# Patient Record
Sex: Male | Born: 1970
Health system: Southern US, Community
[De-identification: ages and names within clinical notes are randomized; demographics above are authoritative.]

## PROBLEM LIST (undated history)

## (undated) DIAGNOSIS — I10 Essential (primary) hypertension: Secondary | ICD-10-CM

## (undated) HISTORY — PX: PATELLAR TENDON REPAIR: SHX737

## (undated) HISTORY — DX: Essential (primary) hypertension: I10

---

## 2009-04-27 ENCOUNTER — Encounter: Payer: Self-pay | Admitting: Orthopedic Surgery

## 2009-04-27 ENCOUNTER — Emergency Department (HOSPITAL_COMMUNITY): Admission: EM | Admit: 2009-04-27 | Discharge: 2009-04-28 | Payer: Self-pay | Admitting: Emergency Medicine

## 2009-04-28 ENCOUNTER — Ambulatory Visit: Payer: Self-pay | Admitting: Orthopedic Surgery

## 2009-04-28 DIAGNOSIS — S5290XA Unspecified fracture of unspecified forearm, initial encounter for closed fracture: Secondary | ICD-10-CM | POA: Insufficient documentation

## 2009-05-01 ENCOUNTER — Encounter: Payer: Self-pay | Admitting: Orthopedic Surgery

## 2009-05-18 ENCOUNTER — Ambulatory Visit: Payer: Self-pay | Admitting: Orthopedic Surgery

## 2009-06-18 ENCOUNTER — Ambulatory Visit: Payer: Self-pay | Admitting: Orthopedic Surgery

## 2010-05-12 ENCOUNTER — Ambulatory Visit (HOSPITAL_COMMUNITY): Admission: RE | Admit: 2010-05-12 | Discharge: 2010-05-12 | Payer: Self-pay | Admitting: Family Medicine

## 2011-10-11 ENCOUNTER — Other Ambulatory Visit (HOSPITAL_COMMUNITY): Payer: Self-pay | Admitting: Orthopaedic Surgery

## 2011-10-11 ENCOUNTER — Ambulatory Visit (HOSPITAL_COMMUNITY)
Admission: RE | Admit: 2011-10-11 | Discharge: 2011-10-11 | Disposition: A | Discharge status: Home / Self Care | Payer: 59 | Source: Ambulatory Visit | Attending: Orthopaedic Surgery | Admitting: Orthopaedic Surgery

## 2011-10-11 DIAGNOSIS — M25579 Pain in unspecified ankle and joints of unspecified foot: Secondary | ICD-10-CM | POA: Insufficient documentation

## 2011-10-11 DIAGNOSIS — M79671 Pain in right foot: Secondary | ICD-10-CM

## 2011-10-11 DIAGNOSIS — R937 Abnormal findings on diagnostic imaging of other parts of musculoskeletal system: Secondary | ICD-10-CM | POA: Insufficient documentation

## 2012-01-31 ENCOUNTER — Ambulatory Visit (INDEPENDENT_AMBULATORY_CARE_PROVIDER_SITE_OTHER): Payer: 59 | Admitting: Urology

## 2012-01-31 DIAGNOSIS — Z3009 Encounter for other general counseling and advice on contraception: Secondary | ICD-10-CM

## 2012-07-02 ENCOUNTER — Other Ambulatory Visit: Payer: Self-pay | Admitting: Orthopedic Surgery

## 2012-07-02 DIAGNOSIS — S93409A Sprain of unspecified ligament of unspecified ankle, initial encounter: Secondary | ICD-10-CM

## 2012-07-03 ENCOUNTER — Ambulatory Visit (HOSPITAL_COMMUNITY)
Admission: RE | Admit: 2012-07-03 | Discharge: 2012-07-03 | Disposition: A | Discharge status: Home / Self Care | Payer: 59 | Source: Ambulatory Visit | Attending: Orthopedic Surgery | Admitting: Orthopedic Surgery

## 2012-07-03 DIAGNOSIS — Y929 Unspecified place or not applicable: Secondary | ICD-10-CM | POA: Insufficient documentation

## 2012-07-03 DIAGNOSIS — X500XXA Overexertion from strenuous movement or load, initial encounter: Secondary | ICD-10-CM | POA: Insufficient documentation

## 2012-07-03 DIAGNOSIS — M25476 Effusion, unspecified foot: Secondary | ICD-10-CM | POA: Insufficient documentation

## 2012-07-03 DIAGNOSIS — S8263XA Displaced fracture of lateral malleolus of unspecified fibula, initial encounter for closed fracture: Secondary | ICD-10-CM | POA: Insufficient documentation

## 2012-07-03 DIAGNOSIS — M25473 Effusion, unspecified ankle: Secondary | ICD-10-CM | POA: Insufficient documentation

## 2012-07-03 DIAGNOSIS — S93409A Sprain of unspecified ligament of unspecified ankle, initial encounter: Secondary | ICD-10-CM

## 2012-07-03 DIAGNOSIS — M25579 Pain in unspecified ankle and joints of unspecified foot: Secondary | ICD-10-CM | POA: Insufficient documentation

## 2012-07-04 ENCOUNTER — Ambulatory Visit (INDEPENDENT_AMBULATORY_CARE_PROVIDER_SITE_OTHER): Payer: 59 | Admitting: Orthopedic Surgery

## 2012-07-04 ENCOUNTER — Encounter: Payer: Self-pay | Admitting: Orthopedic Surgery

## 2012-07-04 VITALS — BP 126/80 | Ht 70.0 in | Wt 206.0 lb

## 2012-07-04 DIAGNOSIS — S93409A Sprain of unspecified ligament of unspecified ankle, initial encounter: Secondary | ICD-10-CM

## 2012-07-04 NOTE — Patient Instructions (Addendum)
ASO X 3 WEEKS  ANKLE EXERCISES / ALPHABET

## 2012-07-04 NOTE — Progress Notes (Signed)
Patient ID: David Cox, male   DOB: May 05, 1971, 41 y.o.   MRN: [id number] Chief Complaint  Patient presents with  . Ankle Injury    right ankle injury, DOI 06/22/12    Stepped in a hole or injured the ankle inverting it at his home about a week and half ago. Persistent swelling. I saw him at a meeting and he still had pain and recommended x-ray. X-ray shows an avulsion fracture, distal fibula.  Review of systems swelling, otherwise, normal.  Exam vital signs stable, as recorded. Appears normal. He is oriented x3. Mood and affect are normal. Gait is surprisingly normal. He does have swelling around the ankle joint tenderness at the tip of the fibula and some what behind the fibula. This abnormal range of motion, stability, and strength. Skin is intact. Mild swelling is noted.  Ankle sprain with avulsion fracture.  Recommend ASO brace

## 2013-06-04 ENCOUNTER — Ambulatory Visit (INDEPENDENT_AMBULATORY_CARE_PROVIDER_SITE_OTHER): Payer: 59 | Admitting: Sports Medicine

## 2013-06-04 VITALS — BP 126/91 | Ht 70.0 in | Wt 198.0 lb

## 2013-06-04 DIAGNOSIS — M25579 Pain in unspecified ankle and joints of unspecified foot: Secondary | ICD-10-CM | POA: Insufficient documentation

## 2013-06-04 DIAGNOSIS — M25572 Pain in left ankle and joints of left foot: Secondary | ICD-10-CM

## 2013-06-04 NOTE — Assessment & Plan Note (Signed)
arch strap  Foam cushions of the left fifth ray  Because he is starting to develop significant foot breakdown of think we need to get him into a custom orthotic for walking or more intense exercise  We will give him a temporary orthotic until he can be scheduled for custom ones

## 2013-06-04 NOTE — Progress Notes (Signed)
Patient ID: David Cox, male   DOB: 11/24/1970, 42 y.o.   MRN: [id number]  Patient has increased his walking for fitness He also is doing a lot more walking in the hospital as he recently became EVP of [facility] and is walking to see how different departments work He has developed some fairly severe pain on the outside of his left foot at the base of the fifth metatarsal No recent injury No sprain  He played basketball growing up and has a patellar tendon rupture on the right that required repair He thinks that for a year or so he put a lot more pressure on his left foot because of recovering from that injury  Physical examination  Pleasant male in no acute distress  Standing reveals that he has moderate loss of the longitudinal arch bilaterally Rare foot alignment is good and posterior tibialis tendon works bilaterally The left lateral column is starting to sublux in the left little toe is now displaced superiorly and starting to ride over the fourth toe  The right fifth toe shows mild subluxation at the MTP joint  Tenderness is significant at the base of the fifth metatarsal only on the left This does not seem painful with resistance of eversion  Ultrasound Peroneal tendons look intact scan from above the malleolus to the insertion at the base of the fifth There is increased Doppler activity at the insertion of the peroneal brevis to the base of the fifth Mild swelling only at the base of the fifth Fifth metatarsal looks intact

## 2014-01-29 ENCOUNTER — Ambulatory Visit (INDEPENDENT_AMBULATORY_CARE_PROVIDER_SITE_OTHER): Payer: 59 | Admitting: Sports Medicine

## 2014-01-29 VITALS — BP 116/70 | Ht 70.0 in | Wt 198.0 lb

## 2014-01-29 DIAGNOSIS — M25569 Pain in unspecified knee: Secondary | ICD-10-CM

## 2014-01-29 DIAGNOSIS — M7650 Patellar tendinitis, unspecified knee: Secondary | ICD-10-CM

## 2014-01-29 DIAGNOSIS — M25561 Pain in right knee: Secondary | ICD-10-CM | POA: Insufficient documentation

## 2014-01-29 DIAGNOSIS — M25562 Pain in left knee: Principal | ICD-10-CM

## 2014-01-29 MED ORDER — NITROGLYCERIN 0.2 MG/HR TD PT24: MEDICATED_PATCH | TRANSDERMAL | Status: DC

## 2014-01-29 NOTE — Progress Notes (Signed)
Patient ID: David Cox, male   DOB: 12/11/1970, 43 y.o.   MRN: [id number]  Rupture of RT patellar tendon 96/97 Repair next day (Dr Rennis HardingEllis) Rehab p surg. Hx of Osgood Schlatter's  Since then did not run Last 3 to 4 years walks fitness Does weights Now doing more activity with spin Personal training 3x week  Squats with kettle bell/ pain at 45 deg [place] hurt knee  Sitting for a couple hours causes anterior knee pain bilaterally  Hx of RT ankle fractures - smaller avulsion type   PEXAM  NAD  BP 116/70  Ht 5\' 10"  (1.778 m)  Wt 198 lb (89.812 kg)  BMI 28.41 kg/m2  RT Knee: Normal to inspection except long midline scar  with no erythema or effusion or obvious bony abnormalities. Palpation normal with no warmth or joint line tenderness  or patellar tenderness or condyle tenderness. ROM normal in flexion and extension and lower leg rotation. Ligaments with solid consistent endpoints including ACL, PCL, LCL, MCL. Negative Mcmurray's and provocative meniscal tests. Non painful patellar compression. Patellar and quadriceps tendons are both somewhat TT deep palpation Hamstring and quadriceps strength is normal except Weakness of VMO Weakness of glut medius and glut maximus on abduction testing of leg   LT Knee Knee: Normal to inspection with no erythema or effusion or obvious bony abnormalities. Palpation normal with no warmth or joint line tenderness or patellar tenderness or condyle tenderness. ROM normal in flexion and extension and lower leg rotation. Ligaments with solid consistent endpoints including ACL, PCL, LCL, MCL. Negative Mcmurray's and provocative meniscal tests. Non painful patellar compression. Patellar and quadriceps tendons unremarkable. Hamstring and quadriceps strength is normal except milder weakness in VMO Hip abduction strength is normal  MSK US RT Knee Hypoechoic area in quadriceps tendon/ no true tear noted NO SP pouch effusion PF joint  looks OK Meniscus OK Patellar tendon shows an area of irregularity and small split in tendon mid substance Tibial tubercle shows irregularity and calcification  LT Knee Normal QT/ meniscus/ TIB tubercle Patellar tendon shows hypoechoic area over medial insertion to patella Hypoechoic change medial 40% of tendon on transverse view

## 2014-01-29 NOTE — Assessment & Plan Note (Signed)
Suspect this will improve with rehab exercises  Needs better muscle balance and there is overall weakness of RT hip abduction

## 2014-01-29 NOTE — Assessment & Plan Note (Signed)
Start NTG protocol as he has structural damage of both patellar tendons  Refer to formal PT  Avoid deep knee bends or sprinting  Reck 6 weeks

## 2014-01-29 NOTE — Patient Instructions (Addendum)
Please do suggested exercises daily  Nitroglycerin Protocol   Apply 1/4 nitroglycerin patch to affected area daily.  Change position of patch within the affected area every 24 hours.  You may experience a headache during the first 1-2 weeks of using the patch, these should subside.  If you experience headaches after beginning nitroglycerin patch treatment, you may take your preferred over the counter pain reliever.  Another side effect of the nitroglycerin patch is skin irritation or rash related to patch adhesive.  Please notify our office if you develop more severe headaches or rash, and stop the patch.  Tendon healing with nitroglycerin patch may require 12 to 24 weeks depending on the extent of injury.  Men should not use if taking Viagra, Cialis, or Levitra.   Do not use if you have migraines or rosacea.   Dr. Darrick PennaFields would like you to start going to physical therapy  Please follow up in 6 weeks  Thank you for seeing David Cox today!

## 2014-01-30 NOTE — Addendum Note (Signed)
Addended by: Jacki ConesMARTIN, AMY C on: 01/30/2014 11:17 AM   Modules accepted: Orders

## 2014-02-18 ENCOUNTER — Ambulatory Visit: Payer: 59 | Admitting: Physical Therapy

## 2014-02-25 ENCOUNTER — Ambulatory Visit: Payer: 59 | Attending: Sports Medicine | Admitting: Physical Therapy

## 2014-02-25 DIAGNOSIS — IMO0001 Reserved for inherently not codable concepts without codable children: Secondary | ICD-10-CM | POA: Insufficient documentation

## 2014-02-25 DIAGNOSIS — M25569 Pain in unspecified knee: Secondary | ICD-10-CM | POA: Insufficient documentation

## 2014-03-21 ENCOUNTER — Ambulatory Visit: Payer: 59 | Attending: Sports Medicine | Admitting: Physical Therapy

## 2014-03-21 DIAGNOSIS — Z5189 Encounter for other specified aftercare: Secondary | ICD-10-CM | POA: Insufficient documentation

## 2014-03-21 DIAGNOSIS — M25569 Pain in unspecified knee: Secondary | ICD-10-CM | POA: Insufficient documentation

## 2014-03-27 ENCOUNTER — Ambulatory Visit: Payer: 59 | Admitting: Physical Therapy

## 2014-04-04 ENCOUNTER — Ambulatory Visit: Payer: 59 | Admitting: Physical Therapy

## 2014-04-08 ENCOUNTER — Encounter: Payer: 59 | Admitting: Physical Therapy

## 2014-04-18 ENCOUNTER — Ambulatory Visit: Payer: 59 | Attending: Sports Medicine | Admitting: Physical Therapy

## 2014-04-18 DIAGNOSIS — M25569 Pain in unspecified knee: Secondary | ICD-10-CM | POA: Insufficient documentation

## 2014-04-18 DIAGNOSIS — Z5189 Encounter for other specified aftercare: Secondary | ICD-10-CM | POA: Insufficient documentation

## 2015-03-03 ENCOUNTER — Inpatient Hospital Stay (HOSPITAL_COMMUNITY)
Admission: RE | Admit: 2015-03-03 | Discharge: 2015-03-03 | Disposition: A | Discharge status: Home / Self Care | Payer: Self-pay | Attending: Pulmonary Disease | Admitting: Pulmonary Disease

## 2015-03-03 LAB — COMPREHENSIVE METABOLIC PANEL
ALK PHOS: 62 U/L (ref 39–117)
ALT: 36 U/L (ref 0–53)
ANION GAP: 10 (ref 5–15)
AST: 34 U/L (ref 0–37)
Albumin: 4.2 g/dL (ref 3.5–5.2)
BUN: 14 mg/dL (ref 6–23)
CALCIUM: 9.6 mg/dL (ref 8.4–10.5)
CHLORIDE: 98 mmol/L (ref 96–112)
CO2: 25 mmol/L (ref 19–32)
Creatinine, Ser: 1.09 mg/dL (ref 0.50–1.35)
GFR calc Af Amer: >90 mL/min (ref >90)
GFR, EST NON AFRICAN AMERICAN: 81 mL/min — AB (ref >90)
Glucose, Bld: 104 mg/dL — ABNORMAL HIGH (ref 70–99)
Potassium: 3.8 mmol/L (ref 3.5–5.1)
SODIUM: 133 mmol/L — AB (ref 135–145)
TOTAL PROTEIN: 7.3 g/dL (ref 6.0–8.3)
Total Bilirubin: 0.6 mg/dL (ref 0.3–1.2)

## 2015-03-03 LAB — LIPID PANEL
CHOLESTEROL: 229 mg/dL — AB (ref 0–200)
HDL: 51 mg/dL (ref >39)
LDL Cholesterol: 123 mg/dL — ABNORMAL HIGH (ref 0–99)
TRIGLYCERIDES: 273 mg/dL — AB (ref <150)
Total CHOL/HDL Ratio: 4.5 RATIO
VLDL: 55 mg/dL — AB (ref 0–40)

## 2015-03-03 LAB — CBC
HCT: 44.8 % (ref 39.0–52.0)
HEMOGLOBIN: 15.7 g/dL (ref 13.0–17.0)
MCH: 31.7 pg (ref 26.0–34.0)
MCHC: 35 g/dL (ref 30.0–36.0)
MCV: 90.3 fL (ref 78.0–100.0)
PLATELETS: 297 10*3/uL (ref 150–400)
RBC: 4.96 MIL/uL (ref 4.22–5.81)
RDW: 12.6 % (ref 11.5–15.5)
WBC: 6.4 10*3/uL (ref 4.0–10.5)

## 2015-03-03 LAB — TSH: TSH: 1.536 u[IU]/mL (ref 0.350–4.500)

## 2015-06-08 ENCOUNTER — Other Ambulatory Visit (HOSPITAL_COMMUNITY): Payer: Self-pay | Admitting: Internal Medicine

## 2015-06-08 ENCOUNTER — Ambulatory Visit (HOSPITAL_COMMUNITY)
Admission: RE | Admit: 2015-06-08 | Discharge: 2015-06-08 | Disposition: A | Discharge status: Home / Self Care | Payer: 59 | Source: Ambulatory Visit | Attending: Internal Medicine | Admitting: Internal Medicine

## 2015-06-08 DIAGNOSIS — R918 Other nonspecific abnormal finding of lung field: Secondary | ICD-10-CM | POA: Diagnosis not present

## 2015-06-08 DIAGNOSIS — J189 Pneumonia, unspecified organism: Secondary | ICD-10-CM

## 2015-06-08 DIAGNOSIS — R05 Cough: Secondary | ICD-10-CM | POA: Diagnosis present

## 2015-12-02 ENCOUNTER — Other Ambulatory Visit: Payer: Self-pay | Admitting: *Deleted

## 2015-12-02 DIAGNOSIS — M79671 Pain in right foot: Secondary | ICD-10-CM

## 2015-12-03 ENCOUNTER — Ambulatory Visit (HOSPITAL_COMMUNITY)
Admission: RE | Admit: 2015-12-03 | Discharge: 2015-12-03 | Disposition: A | Discharge status: Home / Self Care | Payer: 59 | Source: Ambulatory Visit | Attending: Sports Medicine | Admitting: Sports Medicine

## 2015-12-03 DIAGNOSIS — S99921A Unspecified injury of right foot, initial encounter: Secondary | ICD-10-CM | POA: Diagnosis not present

## 2015-12-03 DIAGNOSIS — M79671 Pain in right foot: Secondary | ICD-10-CM | POA: Insufficient documentation

## 2015-12-04 ENCOUNTER — Ambulatory Visit (INDEPENDENT_AMBULATORY_CARE_PROVIDER_SITE_OTHER): Payer: 59 | Admitting: Sports Medicine

## 2015-12-04 ENCOUNTER — Encounter: Payer: Self-pay | Admitting: Sports Medicine

## 2015-12-04 VITALS — BP 118/76 | Ht 70.0 in

## 2015-12-04 DIAGNOSIS — S92403A Displaced unspecified fracture of unspecified great toe, initial encounter for closed fracture: Secondary | ICD-10-CM | POA: Insufficient documentation

## 2015-12-04 DIAGNOSIS — S92401A Displaced unspecified fracture of right great toe, initial encounter for closed fracture: Secondary | ICD-10-CM

## 2015-12-04 NOTE — Progress Notes (Signed)
   Subjective:    Patient ID: David Cox, male    DOB: [birth year], 45 y.o.   MRN: [id number]  HPI chief complaint: Right foot pain  Datron comes in today complaining of 3 weeks of right great toe and foot pain. He slipped 3 weeks ago suffering a twisting injury to both the right knee and the right foot and ankle. He began to experience pain and swelling in the right great toe shortly thereafter. As a result of his injury he has been limping somewhat. Over the past week or so he has begun to notice pain and swelling along the lateral foot as well. He has been taking intermittent ibuprofen which has been helpful. Symptoms are most noticeable with prolonged standing and walking. He has tried to remain active in his cross fit class but has been unable to do things like box jumps due to his pain.   Interim medical history reviewed Surgical history is significant for a previous right knee patellar tendon rupture which required a reconstruction. This was several years ago. Medications reviewed Allergies reviewed    Review of Systems    as above Objective:   Physical Exam  Well-developed, well-nourished. No acute distress.   Right foot: There is mild swelling along the lateral aspect of the foot as well as concentrated around the right great toe. He is tender to palpation along the proximal phalanx. He does have pain with active and passive flexion and extension. Extensor and flexor tendons are intact. He has some generalized tenderness to palpation along the lateral aspect of his foot. No tenderness to palpation at the Lisfranc joint. Neurovascularly intact distally. Walking with a slight limp.  X-rays of the right foot show a minimally displaced fracture through the shaft of the proximal phalanx of the right great toe. Fracture does not appear to be intra-articular. No other fractures are seen.  MSK ultrasound of the right great toe also shows a fracture through the medial most aspect of the  distal shaft of the proximal phalanx. This is seen both in long and short view.      Assessment & Plan:  Minimally displaced proximal phalanx fracture, right great toe  Postop shoe to wear with activity, specifically walking and standing. He may bike or row for cardiovascular conditioning in his cross fit class but he is to do no high impact activity such as running or jumping. Follow-up with me in 3 weeks for reevaluation and repeat ultrasound. It may be a full 6 weeks before he is able to return to all activity. I think the pain and swelling along the lateral aspect of his foot is compensatory due to an altered gait. He will switch to Tylenol for pain control but I think he is okay to continue with intermittent ibuprofen as needed as well. He will call me with any questions or concerns that he has prior to his follow-up visit.

## 2015-12-07 MED FILL — EDARBYCLOR 40-12.5 MG TAB: 40-12.5 | 30 days supply | Qty: 30 | Fill #3

## 2015-12-14 DIAGNOSIS — H5213 Myopia, bilateral: Secondary | ICD-10-CM | POA: Diagnosis not present

## 2015-12-30 ENCOUNTER — Ambulatory Visit (INDEPENDENT_AMBULATORY_CARE_PROVIDER_SITE_OTHER): Payer: 59 | Admitting: Sports Medicine

## 2015-12-30 ENCOUNTER — Encounter: Payer: Self-pay | Admitting: Sports Medicine

## 2015-12-30 VITALS — BP 134/80 | Ht 70.0 in

## 2015-12-30 DIAGNOSIS — S92401D Displaced unspecified fracture of right great toe, subsequent encounter for fracture with routine healing: Secondary | ICD-10-CM | POA: Diagnosis not present

## 2015-12-31 NOTE — Progress Notes (Signed)
   Subjective:    Patient ID: David Cox, male    DOB: 1971/09/26, 45 y.o.   MRN: [id number]  HPI   David Cox comes in today for follow-up on his right great toe fracture. He is 3 weeks out from diagnosis. Approximately 5 weeks out from his injury. He is doing well. He has been in his postop shoe for the most part since his last office visit with me. Pain has improved dramatically. He still has occasions where he will feel some discomfort. But overall he is feeling much better.    Review of Systems     Objective:   Physical Exam  Well-developed, well-nourished. No acute distress.  Right great toe: There is limited range of motion secondary to immobilization. Mild residual swelling. Mild tenderness to palpation over the fracture site. No clinical angulation or malrotation. Flexor and extensor tendons remain intact. Neurovascularly intact distally. Walking without a limp in his postop shoe.  MSK ultrasound of the right great toe was performed. There is still a definite break in the cortex of the proximal phalanx along the medialmost aspect of the shaft. There is a double shadow on the short view which is consistent with soft callus formation.      Assessment & Plan:   3 weeks status post nondisplaced proximal phalanx fracture right great toe  Clinically the patient is doing well. I would like to correlate what I see on the ultrasound with a repeat x-ray. I think David Cox can wean from his postop shoe into a regular shoe as his symptoms allow. David Cox was asking specifically about when he can return to his normal workouts. I think he can start some recreational walking based on pain but he understands that if he has increasing discomfort then he will need to discontinue this until follow-up with me. He may certainly continue with biking. He may also start swimming. He is to refrain from box jumps and pushups at least until follow-up. Follow-up with me in 3 weeks.

## 2016-01-11 MED FILL — EDARBYCLOR 40-12.5 MG TAB: 40-12.5 | 30 days supply | Qty: 30 | Fill #4

## 2016-01-15 ENCOUNTER — Ambulatory Visit (HOSPITAL_COMMUNITY)
Admission: RE | Admit: 2016-01-15 | Discharge: 2016-01-15 | Disposition: A | Discharge status: Home / Self Care | Payer: 59 | Source: Ambulatory Visit | Attending: Sports Medicine | Admitting: Sports Medicine

## 2016-01-15 DIAGNOSIS — S92401D Displaced unspecified fracture of right great toe, subsequent encounter for fracture with routine healing: Secondary | ICD-10-CM | POA: Diagnosis not present

## 2016-01-15 DIAGNOSIS — X58XXXD Exposure to other specified factors, subsequent encounter: Secondary | ICD-10-CM | POA: Diagnosis not present

## 2016-01-15 DIAGNOSIS — S92311D Displaced fracture of first metatarsal bone, right foot, subsequent encounter for fracture with routine healing: Secondary | ICD-10-CM | POA: Diagnosis not present

## 2016-01-20 ENCOUNTER — Ambulatory Visit (INDEPENDENT_AMBULATORY_CARE_PROVIDER_SITE_OTHER): Payer: 59 | Admitting: Sports Medicine

## 2016-01-20 ENCOUNTER — Encounter: Payer: Self-pay | Admitting: Sports Medicine

## 2016-01-20 VITALS — BP 128/88 | HR 91 | Ht 70.0 in | Wt 200.0 lb

## 2016-01-20 DIAGNOSIS — S92401D Displaced unspecified fracture of right great toe, subsequent encounter for fracture with routine healing: Secondary | ICD-10-CM | POA: Diagnosis not present

## 2016-01-20 NOTE — Progress Notes (Signed)
   Subjective:    Patient ID: David Cox, male    DOB: 09/07/1971, 45 y.o.   MRN: [id number]  HPI   David Cox comes in today for follow-up on his minimally displaced proximal phalanx fracture of his right great toe. He is 6 weeks out from diagnosis, approximately 8 weeks out from injury. He is doing well. He is walking in a regular shoe. He gets occasional twinges of pain but nothing long lasting. Swelling has resolved.       Review of Systems     Objective:   Physical Exam  Well-developed, well-nourished. No acute distress.   Right great toe: Minimal tenderness to palpation over the fracture site. Swelling has resolved. There is still some mild residual stiffness at the first MTP joint. No clinical angulation or malrotation. Neurovascularly intact distally. Walking without a limp.  MSK ultrasound of the right great toe shows good callus formation surrounding the fracture. X-ray of the right great toe shows the fracture lines are no longer be present. There is good sclerosis along the fracture. Findings consistent with a nearly healed minimally displaced proximal phalanx fracture.      Assessment & Plan:  Well healing proximal phalanx fracture, right great toe  I think the patient is okay to increase all activity as tolerated. If he experiences pain with any particular activity, then he needs to discontinue that specific activity for another week or so. He also understands that there may be occasion where he feels some twinges of pain, but it should not be severe and it certainly should not limit his activity. This is normal bone remodeling and those twinges will resolve over time. I will discharge him from my care to follow-up as needed.

## 2016-02-09 MED FILL — EDARBYCLOR 40-12.5 MG TAB: 40-12.5 | 30 days supply | Qty: 30 | Fill #5

## 2016-03-14 MED FILL — EDARBYCLOR 40-12.5 MG TAB: 40-12.5 | 30 days supply | Qty: 30 | Fill #6

## 2016-03-29 ENCOUNTER — Telehealth (HOSPITAL_COMMUNITY): Payer: Self-pay | Admitting: *Deleted

## 2016-03-29 DIAGNOSIS — I10 Essential (primary) hypertension: Secondary | ICD-10-CM

## 2016-03-29 NOTE — Telephone Encounter (Signed)
Per Dr Gala RomneyBensimhon pt needs Calcium Scoring CT scan, order placed pt aware they will contact him to schedule

## 2016-04-12 MED FILL — EDARBYCLOR 40-12.5 MG TAB: 40-12.5 | 30 days supply | Qty: 30 | Fill #7

## 2016-04-20 ENCOUNTER — Ambulatory Visit (INDEPENDENT_AMBULATORY_CARE_PROVIDER_SITE_OTHER)
Admission: RE | Admit: 2016-04-20 | Discharge: 2016-04-20 | Disposition: A | Discharge status: Home / Self Care | Payer: 59 | Source: Ambulatory Visit | Attending: Internal Medicine | Admitting: Internal Medicine

## 2016-04-20 DIAGNOSIS — I10 Essential (primary) hypertension: Secondary | ICD-10-CM

## 2016-04-21 ENCOUNTER — Telehealth (HOSPITAL_COMMUNITY): Payer: Self-pay | Admitting: *Deleted

## 2016-04-21 NOTE — Telephone Encounter (Signed)
Heather to give CT results.

## 2016-05-12 MED FILL — EDARBYCLOR 40-12.5 MG TAB: 40-12.5 | 30 days supply | Qty: 30 | Fill #8

## 2016-06-15 MED FILL — EDARBYCLOR 40-12.5 MG TAB: 40-12.5 | 30 days supply | Qty: 30 | Fill #9

## 2016-07-18 MED FILL — EDARBYCLOR 40-12.5 MG TAB: 40-12.5 | 30 days supply | Qty: 30 | Fill #10

## 2016-08-19 MED FILL — EDARBYCLOR 40-12.5 MG TAB: 40-12.5 | 30 days supply | Qty: 30 | Fill #11

## 2016-09-19 MED FILL — EDARBYCLOR 40-12.5 MG TAB: 40-12.5 | 30 days supply | Qty: 30 | Fill #0

## 2016-09-27 ENCOUNTER — Encounter: Payer: Self-pay | Admitting: Sports Medicine

## 2016-09-27 ENCOUNTER — Ambulatory Visit: Payer: Self-pay

## 2016-09-27 ENCOUNTER — Ambulatory Visit (INDEPENDENT_AMBULATORY_CARE_PROVIDER_SITE_OTHER): Payer: 59 | Admitting: Sports Medicine

## 2016-09-27 VITALS — BP 135/74 | HR 90 | Ht 70.0 in | Wt 212.0 lb

## 2016-09-27 DIAGNOSIS — M25562 Pain in left knee: Secondary | ICD-10-CM

## 2016-09-27 DIAGNOSIS — G8929 Other chronic pain: Secondary | ICD-10-CM

## 2016-09-27 DIAGNOSIS — M7652 Patellar tendinitis, left knee: Secondary | ICD-10-CM

## 2016-09-27 DIAGNOSIS — M7651 Patellar tendinitis, right knee: Secondary | ICD-10-CM

## 2016-09-27 DIAGNOSIS — M25561 Pain in right knee: Secondary | ICD-10-CM | POA: Diagnosis not present

## 2016-09-27 NOTE — Patient Instructions (Signed)
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

## 2016-09-27 NOTE — Assessment & Plan Note (Signed)
Significant improvement with a strength program  He needs to continue this but modified biomechanical positioning

## 2016-09-27 NOTE — Assessment & Plan Note (Signed)
He has aggravated the quadriceps tendon on the left radius and calcific spurring  The can be treated conservatively  I suspect he needs a modified approach to squats with less knee bend  Home exercise program and gradual return to activity outlined  Use compression sleeves for lifting

## 2016-09-27 NOTE — Progress Notes (Signed)
Chief Complaint  Left knee pain  Patient has experienced some swelling in his left knee this week He has been in a modified cross fit program This involves some moderately deep squats He has been steadily increasing his weight The exercise program had led to a lot of relief of his knee pain  However, this past week he did a lot of walking and standing in the hospital He noted that his knee was swollen after that He had a lot of clicking with flexion of the knee  Past history Bilateral patellar tendinitis Patellar tendon rupture playing basketball years ago repaired on the right He has done formal physical therapy and progressed to a fitness program  Review of systems No locking No giving way No feeling of instability  Physical examination Muscular white male in no acute distress BP 135/74   Pulse 90   Ht 5\' 10"  (1.778 m)   Wt 212 lb (96.2 kg)   BMI 30.42 kg/m   Knee: Left Normal to inspection with no erythema or effusion or obvious bony abnormalities. Palpation normal with no warmth or joint line tenderness or patellar tenderness or condyle tenderness. ROM normal in flexion and extension and lower leg rotation. Ligaments with solid consistent endpoints including ACL, PCL, LCL, MCL. Negative Mcmurray's and provocative meniscal tests. Non painful patellar compression. Patellar and quadriceps tendons unremarkable. Hamstring and quadriceps strength is normal.  1 leg knee bend brings out mild medial pain And causes clicking at the top of the patella Thessaly test is negative  Ultrasound of left knee  Suprapatellar pouch shows some localized swelling but no effusion There are calcific spurs from the superior patella into the quadriceps tendon There is a hypoechoic area within the quadriceps tendon with some calcification Medial and lateral meniscus appear intact Patellar tendon appears intact there is a small spur at the patellar insertion No Baker's cyst  Comparison of  right knee reveals no spurring at the superior patella The patellar tendon has some loss of the normal fibrillary pattern and calcification in area of previous surgery There is a small avulsion off the distal patellar pole Evidence of tibial tuberosity never completely fusing with separation at growth plate  Impression; he has some quadriceps tendinopathy triggered by calcific spurs on the left No ultrasound evidence of intra-articular injury Right knee shows ultrasound changes consistent with repair of patellar tendon. Small avulsion fracture off distal patellar pole and remote Osgood-Schlatter's  Ultrasound & interpretation Royal HawthornKarl Aden Youngman M.D.

## 2016-10-18 MED FILL — EDARBYCLOR 40-12.5 MG TAB: 40-12.5 | 30 days supply | Qty: 30 | Fill #1

## 2016-11-17 DIAGNOSIS — Z0000 Encounter for general adult medical examination without abnormal findings: Secondary | ICD-10-CM | POA: Diagnosis not present

## 2016-11-24 MED FILL — EDARBYCLOR 40-12.5 MG TAB: 40-12.5 | 30 days supply | Qty: 30 | Fill #2

## 2016-12-02 ENCOUNTER — Other Ambulatory Visit (HOSPITAL_COMMUNITY)
Admission: RE | Admit: 2016-12-02 | Discharge: 2016-12-02 | Disposition: A | Discharge status: Home / Self Care | Payer: 59 | Source: Ambulatory Visit | Attending: Pulmonary Disease | Admitting: Pulmonary Disease

## 2016-12-02 DIAGNOSIS — Z0000 Encounter for general adult medical examination without abnormal findings: Secondary | ICD-10-CM | POA: Diagnosis not present

## 2016-12-02 LAB — CBC
HCT: 45.1 % (ref 39.0–52.0)
Hemoglobin: 15.6 g/dL (ref 13.0–17.0)
MCH: 31.1 pg (ref 26.0–34.0)
MCHC: 34.6 g/dL (ref 30.0–36.0)
MCV: 89.8 fL (ref 78.0–100.0)
PLATELETS: 295 10*3/uL (ref 150–400)
RBC: 5.02 MIL/uL (ref 4.22–5.81)
RDW: 12.7 % (ref 11.5–15.5)
WBC: 8.1 10*3/uL (ref 4.0–10.5)

## 2016-12-02 LAB — COMPREHENSIVE METABOLIC PANEL
ALBUMIN: 4.3 g/dL (ref 3.5–5.0)
ALT: 39 U/L (ref 17–63)
AST: 36 U/L (ref 15–41)
Alkaline Phosphatase: 53 U/L (ref 38–126)
Anion gap: 10 (ref 5–15)
BUN: 14 mg/dL (ref 6–20)
CHLORIDE: 100 mmol/L — AB (ref 101–111)
CO2: 26 mmol/L (ref 22–32)
CREATININE: 1.04 mg/dL (ref 0.61–1.24)
Calcium: 9.6 mg/dL (ref 8.9–10.3)
GFR calc non Af Amer: >60 mL/min (ref >60)
GLUCOSE: 101 mg/dL — AB (ref 65–99)
Potassium: 4.1 mmol/L (ref 3.5–5.1)
SODIUM: 136 mmol/L (ref 135–145)
Total Bilirubin: 1 mg/dL (ref 0.3–1.2)
Total Protein: 7.5 g/dL (ref 6.5–8.1)

## 2016-12-02 LAB — LIPID PANEL
Cholesterol: 234 mg/dL — ABNORMAL HIGH (ref 0–200)
HDL: 49 mg/dL (ref >40)
LDL CALC: 162 mg/dL — AB (ref 0–99)
TRIGLYCERIDES: 116 mg/dL (ref <150)
Total CHOL/HDL Ratio: 4.8 RATIO
VLDL: 23 mg/dL (ref 0–40)

## 2016-12-02 LAB — TSH: TSH: 1.014 u[IU]/mL (ref 0.350–4.500)

## 2016-12-03 LAB — TESTOSTERONE: Testosterone: 228 ng/dL — ABNORMAL LOW (ref 264–916)

## 2016-12-19 MED FILL — ROSUVASTATIN CALCIUM 10 MG: 10 | 90 days supply | Qty: 90 | Fill #0

## 2017-01-03 MED FILL — EDARBYCLOR 40-12.5 MG TAB: 40-12.5 | 30 days supply | Qty: 30 | Fill #3

## 2017-01-31 DIAGNOSIS — H5213 Myopia, bilateral: Secondary | ICD-10-CM | POA: Diagnosis not present

## 2017-02-02 MED FILL — EDARBYCLOR 40-12.5 MG TAB: 40-12.5 | 30 days supply | Qty: 30 | Fill #4

## 2017-03-09 ENCOUNTER — Telehealth: Payer: Self-pay

## 2017-03-09 NOTE — Telephone Encounter (Signed)
Left message for patient to call back to schedule appt.

## 2017-03-09 NOTE — Telephone Encounter (Signed)
Patient states he will accept the  appointment on 5/10 at 09:00 am..Erica R Ladona Ridgelaylor

## 2017-03-09 NOTE — Telephone Encounter (Signed)
appt has been scheduled for the pt for 5/10 at 9.

## 2017-03-09 NOTE — Telephone Encounter (Signed)
-----   Message from Oretha Milchakesh Alva V, MD sent at 03/09/2017  9:27 AM EDT ----- Pl offer him appt at 9 am on 5/10 - let him know -may be a wait for 15 mins or so but we willl get him in  RA

## 2017-03-14 MED FILL — EDARBYCLOR 40-12.5 MG TAB: 40-12.5 | 30 days supply | Qty: 30 | Fill #5

## 2017-03-16 ENCOUNTER — Encounter: Payer: Self-pay | Admitting: Pulmonary Disease

## 2017-03-16 ENCOUNTER — Ambulatory Visit (INDEPENDENT_AMBULATORY_CARE_PROVIDER_SITE_OTHER): Payer: 59 | Admitting: Pulmonary Disease

## 2017-03-16 VITALS — BP 118/76 | HR 72 | Ht 70.0 in | Wt 221.0 lb

## 2017-03-16 DIAGNOSIS — G4733 Obstructive sleep apnea (adult) (pediatric): Secondary | ICD-10-CM | POA: Diagnosis not present

## 2017-03-16 NOTE — Patient Instructions (Signed)
Home sleep study 

## 2017-03-16 NOTE — Assessment & Plan Note (Signed)
Given excessive daytime somnolence, narrow pharyngeal exam, witnessed apneas & loud snoring, obstructive sleep apnea is very likely & an overnight polysomnogram will be scheduled as a home study. The pathophysiology of obstructive sleep apnea , it's cardiovascular consequences & modes of treatment including CPAP were discused with the patient in detail & they evidenced understanding.  Pretest probability is intermediate. If no significant apneas, would still desire treatment for snoring

## 2017-03-16 NOTE — Progress Notes (Signed)
Subjective:    Patient ID: David Cox, male    DOB: 02/21/1971, 46 y.o.   MRN: [id number]  HPI  Chief Complaint  Patient presents with  . Sleep Consult    Per pt's wife, he snores a lot at night. Per wife, he falls asleep fast at night.     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 Loud snoring has been noted by his wife DME for many years but increased more recently. He shows me an RDI recording, reports that this is worse on his back and especially when he has an alcoholic beverage before bedtime. He also reports deviated nasal septum and denies significant seasonal allergies. He had a sleep study done here 11 years ago outside [facility] and did not have significant apneas then  Epworth sleepiness score is 6 Bedtime is around 11 PM, but he is often falling asleep on his couch prior to that, sleep latency is minimal, he sleeps on his stomach most of the time or on his side with one pillow, denies nocturnal awakenings or nocturia and is out of bed by 5:30 AM feeling rested without dryness of mouth or headaches. Snoring is loud to the point where his wife has had to move to a different room. His weight is unchanged for the last 2 years and he reports ILD lifestyle including doing cross fit and exercises. Hypertension and hyperlipidemia are well controlled on one medication needs. He denies excessive caffeinated beverages and drinks 2 cups of coffee in the morning. Denies any problems driving  There is no history suggestive of cataplexy, sleep paralysis or parasomnias       Past Medical History:  Diagnosis Date  . HTN (hypertension)       Past Surgical History:  Procedure Laterality Date  . PATELLAR TENDON REPAIR       No Known Allergies   Social History   Social History  . Marital status: Married    Spouse name: N/A  . Number of children: N/A  . Years of education: MASTERS   Occupational History  .  Not on file.   Social History Main Topics  . Smoking status: Never Smoker  . Smokeless tobacco: Never Used  . Alcohol use Yes     Comment: MODERATE; WEEKENDS  . Drug use: No  . Sexual activity: Not on file   Other Topics Concern  . Not on file   Social History Narrative  . No narrative on file     Family History  Problem Relation Age of Onset  . Heart disease Unknown      Review of Systems Constitutional: negative for anorexia, fevers and sweats  Eyes: negative for irritation, redness and visual disturbance  Ears, nose, mouth, throat, and face: negative for earaches, epistaxis, nasal congestion and sore throat  Respiratory: negative for cough, dyspnea on exertion, sputum and wheezing  Cardiovascular: negative for chest pain, dyspnea, lower extremity edema, orthopnea, palpitations and syncope  Gastrointestinal: negative for abdominal pain, constipation, diarrhea, melena, nausea and vomiting  Genitourinary:negative for dysuria, frequency and hematuria  Hematologic/lymphatic: negative for bleeding, easy bruising and lymphadenopathy  Musculoskeletal:negative for arthralgias, muscle weakness and stiff joints  Neurological: negative for coordination problems, gait problems, headaches and weakness  Endocrine: negative for diabetic symptoms including polydipsia, polyuria and weight loss     Objective:   Physical Exam  Gen. Pleasant, obese, in no distress, normal affect ENT - Enlarged tonsils, no post nasal drip, class 2 airway  Neck: No JVD, no thyromegaly, no carotid bruits Lungs: no use of accessory muscles, no dullness to percussion, decreased without rales or rhonchi  Cardiovascular: Rhythm regular, heart sounds  normal, no murmurs or gallops, no peripheral edema Abdomen: soft and non-tender, no hepatosplenomegaly, BS normal. Musculoskeletal: No deformities, no cyanosis or clubbing Neuro:  alert, non focal, no tremors       Assessment & Plan:

## 2017-04-11 MED FILL — ROSUVASTATIN CALCIUM 10 MG: 10 | 90 days supply | Qty: 90 | Fill #1

## 2017-04-12 DIAGNOSIS — G4733 Obstructive sleep apnea (adult) (pediatric): Secondary | ICD-10-CM | POA: Diagnosis not present

## 2017-04-12 MED FILL — ANDROGEL 1.62% GEL PUMP: 20.25 MG/AC | 90 days supply | Qty: 225 | Fill #0

## 2017-04-17 ENCOUNTER — Telehealth: Payer: Self-pay | Admitting: Pulmonary Disease

## 2017-04-17 DIAGNOSIS — G4733 Obstructive sleep apnea (adult) (pediatric): Secondary | ICD-10-CM | POA: Diagnosis not present

## 2017-04-17 NOTE — Telephone Encounter (Signed)
Per RA, HST showed moderate OSA with 20 events per hour in all positions, but increased while sleeping on his back.   He suggests a CPAP titration study if patient is willing.

## 2017-04-18 NOTE — Telephone Encounter (Signed)
Pt is aware of below message and voiced his understanding. Order has been placed for titration. Nothing further needed.

## 2017-04-19 ENCOUNTER — Other Ambulatory Visit: Payer: Self-pay | Admitting: *Deleted

## 2017-04-19 DIAGNOSIS — G4733 Obstructive sleep apnea (adult) (pediatric): Secondary | ICD-10-CM

## 2017-04-21 MED FILL — EDARBYCLOR 40-12.5 MG TAB: 40-12.5 | 30 days supply | Qty: 30 | Fill #6

## 2017-04-24 DIAGNOSIS — N50811 Right testicular pain: Secondary | ICD-10-CM | POA: Diagnosis not present

## 2017-04-30 ENCOUNTER — Ambulatory Visit (HOSPITAL_BASED_OUTPATIENT_CLINIC_OR_DEPARTMENT_OTHER): Payer: 59 | Attending: Pulmonary Disease | Admitting: Pulmonary Disease

## 2017-04-30 VITALS — Ht 70.0 in | Wt 215.0 lb

## 2017-04-30 DIAGNOSIS — G4733 Obstructive sleep apnea (adult) (pediatric): Secondary | ICD-10-CM

## 2017-05-04 ENCOUNTER — Telehealth: Payer: Self-pay | Admitting: Pulmonary Disease

## 2017-05-04 DIAGNOSIS — G4733 Obstructive sleep apnea (adult) (pediatric): Secondary | ICD-10-CM

## 2017-05-04 DIAGNOSIS — G4730 Sleep apnea, unspecified: Secondary | ICD-10-CM | POA: Diagnosis not present

## 2017-05-04 NOTE — Telephone Encounter (Signed)
Please let him know and send prescription to DME for  CPAP  10 cm H2O with a Medium size [employer]ips Respironics Nasal Mask DreamWear mask and heated humidification. -Download in 4 weeks  Office visit with me in 6 weeks  call me as needed for issues

## 2017-05-04 NOTE — Procedures (Signed)
Patient Name: David Cox, [name] Study Date: 04/30/2017 Gender: Male D.O.B: May 11, 1971 Age (years): [age] Referring Provider: Cyril Mourningakesh Jaxxson Cavanah MD, ABSM Height (inches): 70 Interpreting Physician: Cyril Mourningakesh Mayuri Staples MD, ABSM Weight (lbs): 215 RPSGT: Wylie HailDavis, Rico BMI: 31 MRN: [id number] Neck Size: 17.00   CLINICAL INFORMATION The patient is referred for a CPAP titration to treat sleep apnea.  Date of  HST: 04/2017, showed moderate OSA with AHI 20/h  SLEEP STUDY TECHNIQUE As per the AASM Manual for the Scoring of Sleep and Associated Events v2.3 (April 2016) with a hypopnea requiring 4% desaturations.  The channels recorded and monitored were frontal, central and occipital EEG, electrooculogram (EOG), submentalis EMG (chin), nasal and oral airflow, thoracic and abdominal wall motion, anterior tibialis EMG, snore microphone, electrocardiogram, and pulse oximetry. Continuous positive airway pressure (CPAP) was initiated at the beginning of the study and titrated to treat sleep-disordered breathing.  RESPIRATORY PARAMETERS Optimal PAP Pressure (cm): 10 AHI at Optimal Pressure (/hr): 2.5 Overall Minimal O2 (%): 88.00 Supine % at Optimal Pressure (%): 100 Minimal O2 at Optimal Pressure (%): 92.0      SLEEP ARCHITECTURE The study was initiated at 10:37:31 PM and ended at 5:26:54 AM.  Sleep onset time was 27.2 minutes and the sleep efficiency was 88.1%. The total sleep time was 360.5 minutes.  The patient spent 7.49% of the night in stage N1 sleep, 60.61% in stage N2 sleep, 0.42% in stage N3 and 31.48% in REM.Stage REM latency was 61.0 minutes  Wake after sleep onset was 21.7. Alpha intrusion was absent. Supine sleep was 66.57%.  CARDIAC DATA The 2 lead EKG demonstrated sinus rhythm. The mean heart rate was 60.50 beats per minute. Other EKG findings include: None.   LEG MOVEMENT DATA The total Periodic Limb Movements of Sleep (PLMS) were 0. The PLMS index was 0.00. A PLMS index of <15 is considered  normal in adults.  IMPRESSIONS - The optimal PAP pressure was 10 cm of water. - Central sleep apnea was not noted during this titration (CAI = 1.0/h). - Mild oxygen desaturations were observed during this titration (min O2 = 88.00%). - The patient snored with Loud snoring volume during this titration study. - No cardiac abnormalities were observed during this study. - Clinically significant periodic limb movements were not noted during this study. Arousals associated with PLMs were rare.   DIAGNOSIS - Obstructive Sleep Apnea (327.23 [G47.33 ICD-10])   RECOMMENDATIONS - Trial of CPAP therapy on 10 cm H2O with a Medium size Philips Respironics Nasal Mask DreamWear mask and heated humidification. - Avoid alcohol, sedatives and other CNS depressants that may worsen sleep apnea and disrupt normal sleep architecture. - Sleep hygiene should be reviewed to assess factors that may improve sleep quality. - Weight management and regular exercise should be initiated or continued. - Return to [facility] for re-evaluation after 4 weeks of therapy  [Electronically signed] 05/04/2017 01:04 PM  Cyril Mourningakesh Man Effertz MD, ABSM Diplomate, American Board of Sleep Medicine   NPI: [id number]

## 2017-05-04 NOTE — Telephone Encounter (Signed)
Spoke with pt, aware of results/recs.  cpap ordered.  Pt did not have appt book available at this time, states he will call back to schedule a rov.  Will keep message open for Cherina to follow up on appt.

## 2017-05-09 ENCOUNTER — Ambulatory Visit: Payer: 59 | Admitting: Pulmonary Disease

## 2017-05-15 DIAGNOSIS — G4733 Obstructive sleep apnea (adult) (pediatric): Secondary | ICD-10-CM | POA: Diagnosis not present

## 2017-05-17 DIAGNOSIS — Z124 Encounter for screening for malignant neoplasm of cervix: Secondary | ICD-10-CM | POA: Diagnosis not present

## 2017-05-17 DIAGNOSIS — R5383 Other fatigue: Secondary | ICD-10-CM | POA: Diagnosis not present

## 2017-05-17 DIAGNOSIS — N6312 Unspecified lump in the right breast, upper inner quadrant: Secondary | ICD-10-CM | POA: Diagnosis not present

## 2017-05-22 MED FILL — EDARBYCLOR 40-12.5 MG TAB: 40-12.5 | 30 days supply | Qty: 30 | Fill #7

## 2017-06-14 ENCOUNTER — Encounter: Payer: Self-pay | Admitting: Pulmonary Disease

## 2017-06-15 DIAGNOSIS — G4733 Obstructive sleep apnea (adult) (pediatric): Secondary | ICD-10-CM | POA: Diagnosis not present

## 2017-06-19 ENCOUNTER — Telehealth: Payer: Self-pay | Admitting: Pulmonary Disease

## 2017-06-19 NOTE — Telephone Encounter (Signed)
How is he  settling in with his CPAP? Download shows good effect, he has a small leak, needs better seal and few missed nights Can we help in anyway? Please ensure that he has follow-up visit between 30 and 90 days

## 2017-06-21 NOTE — Telephone Encounter (Signed)
Left message for patient to call back regarding his CPAP machine.

## 2017-07-06 MED FILL — EDARBYCLOR 40-12.5 MG TAB: 40-12.5 | 30 days supply | Qty: 30 | Fill #8

## 2017-07-16 DIAGNOSIS — G4733 Obstructive sleep apnea (adult) (pediatric): Secondary | ICD-10-CM | POA: Diagnosis not present

## 2017-08-15 DIAGNOSIS — G4733 Obstructive sleep apnea (adult) (pediatric): Secondary | ICD-10-CM | POA: Diagnosis not present

## 2017-08-15 MED FILL — ROSUVASTATIN CALCIUM 10 MG: 10 | 90 days supply | Qty: 90 | Fill #2

## 2017-08-16 MED FILL — EDARBYCLOR 40-12.5 MG TAB: 40-12.5 | 30 days supply | Qty: 30 | Fill #9

## 2017-08-18 ENCOUNTER — Encounter: Payer: Self-pay | Admitting: Pulmonary Disease

## 2017-08-18 ENCOUNTER — Ambulatory Visit (INDEPENDENT_AMBULATORY_CARE_PROVIDER_SITE_OTHER): Payer: 59 | Admitting: Pulmonary Disease

## 2017-08-18 DIAGNOSIS — G4733 Obstructive sleep apnea (adult) (pediatric): Secondary | ICD-10-CM

## 2017-08-18 NOTE — Patient Instructions (Addendum)
Your CPAP is set at 10 cm and seems to be working well. CPAP supplies will be renewed for a year. Goal would be 20 pound weight loss

## 2017-08-18 NOTE — Progress Notes (Addendum)
   Subjective:    Patient ID: David Cox, male    DOB: 06-24-1971, 46 y.o.   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 He was diagnosed 04/2017 by home sleep study when he presented with symptoms of loud snoring, witnessed apneas and non-refreshing sleep. He was started on CPAP 10 cm with nasal mask after titration study and after a few weeks has gradually adjusted to this. He noticed initial improvement in his daytime somnolence and fatigue but this effect has plateaued. He continues to be active with [employer] exercise routine. His weight is unchanged  He does have hypertension and hyperlipidemia as cardiac risk factors  CPAP download was reviewed which shows good control of events on CPAP 10 cm with average compliance, few minutes nights due to travel  Significant tests/ events reviewed  HST 04/2017 AHI 20/h 04/2017 CPAP 10 cm, nasal  Review of Systems Patient denies significant dyspnea,cough, hemoptysis,  chest pain, palpitations, pedal edema, orthopnea, paroxysmal nocturnal dyspnea, lightheadedness, nausea, vomiting, abdominal or  leg pains      Objective:   Physical Exam  Gen. Pleasant, obese, in no distress ENT - Enlarged tonsils, no post nasal drip Neck: No JVD, no thyromegaly, no carotid bruits Lungs: no use of accessory muscles, no dullness to percussion, decreased without rales or rhonchi  Cardiovascular: Rhythm regular, heart sounds  normal, no murmurs or gallops, no peripheral edema Musculoskeletal: No deformities, no cyanosis or clubbing , no tremors       Assessment & Plan:

## 2017-08-18 NOTE — Assessment & Plan Note (Signed)
CPAP is set at 10 cm and seems to be working well. CPAP supplies will be renewed for a year. Gold would be 20 pound weight loss  Weight loss encouraged, compliance with goal of at least 4-6 hrs every night is the expectation. Advised against medications with sedative side effects Cautioned against driving when sleepy - understanding that sleepiness will vary on a day to day basis

## 2017-09-15 DIAGNOSIS — G4733 Obstructive sleep apnea (adult) (pediatric): Secondary | ICD-10-CM | POA: Diagnosis not present

## 2017-10-03 MED FILL — EDARBYCLOR 40-12.5 MG TAB: 40-12.5 | 30 days supply | Qty: 30 | Fill #0

## 2017-11-16 MED FILL — EDARBYCLOR 40-12.5 MG TAB: 40-12.5 | 30 days supply | Qty: 30 | Fill #1

## 2017-11-20 ENCOUNTER — Encounter: Payer: Self-pay | Admitting: Sports Medicine

## 2017-11-20 ENCOUNTER — Ambulatory Visit (INDEPENDENT_AMBULATORY_CARE_PROVIDER_SITE_OTHER): Payer: 59 | Admitting: Sports Medicine

## 2017-11-20 VITALS — BP 128/78 | Ht 70.0 in | Wt 220.0 lb

## 2017-11-20 DIAGNOSIS — M25561 Pain in right knee: Secondary | ICD-10-CM

## 2017-11-20 NOTE — Progress Notes (Signed)
   Subjective:    Patient ID: David Cox C Nafziger, male    DOB: 04/23/1971, 47 y.o.   MRN: [id number]  HPI chief complaint: Right knee pain  David Cox comes in today complaining of 1 month of right knee pain. No specific injury that he can recall but a sudden onset of pain and swelling along the medial knee. This started shortly after working out. He has modified his workouts, specifically avoided box jumps and his pain has improved but not resolved. He denies any locking of the knee but does get some feelings of instability. He has a history of a patellar tendon repair done on the same knee in either 1995 or 96. He's done well postoperatively. He most recently had an injury about a year ago which resolved with treatment. His current pain is different in nature than anything he has experienced previously. He denies pain at rest.     Review of Systems    As above  Objective:   Physical Exam  Well-developed, well-nourished. No acute distress. Awake alert and oriented 3. Vital signs reviewed  Right knee: Full range of motion. No obvious effusion. He is tender to palpation along the medial joint line with a positive Thessaly's. Pain but no popping with McMurray's. No tenderness laterally. Extensor mechanism is intact. Knee is stable to valgus and varus stressing. Negative anterior drawer, negative posterior drawer. Neurovascularly intact distally. Walking with a slight limp.  MSK ultrasound of the right knee does not show any obvious knee effusion. He does have some mild spurring along the medial joint space at the medial meniscus is difficult to visualize other than an appreciable area of calcification deep within the meniscus consistent with prior injury.      Assessment & Plan:  Right knee pain worrisome for medial meniscal tear Status post remote right knee patellar tendon rupture with repair  X-rays of the right knee specifically to evaluate the amount of post traumatic osteoarthritis. We will  schedule an MRI of the knee specifically to rule out a medial meniscal tear. Phone follow-up with those imaging studies when available. In the meantime, body helix compression sleeve with activity, isometric quad exercises and half squats, and he will continue to modify his activity. He is instructed not to do any sort of jumping or high impact exercise and he is to avoid close chain lower leg workouts. We will delineate further workup and treatment based on his imaging results.

## 2017-11-23 ENCOUNTER — Ambulatory Visit (HOSPITAL_COMMUNITY)
Admission: RE | Admit: 2017-11-23 | Discharge: 2017-11-23 | Disposition: A | Discharge status: Home / Self Care | Payer: 59 | Source: Ambulatory Visit | Attending: Sports Medicine | Admitting: Sports Medicine

## 2017-11-23 DIAGNOSIS — M25861 Other specified joint disorders, right knee: Secondary | ICD-10-CM | POA: Diagnosis not present

## 2017-11-23 DIAGNOSIS — M179 Osteoarthritis of knee, unspecified: Secondary | ICD-10-CM | POA: Diagnosis not present

## 2017-11-23 DIAGNOSIS — M25561 Pain in right knee: Secondary | ICD-10-CM | POA: Insufficient documentation

## 2017-11-23 DIAGNOSIS — M9251 Juvenile osteochondrosis of tibia and fibula, right leg: Secondary | ICD-10-CM | POA: Diagnosis not present

## 2017-11-24 ENCOUNTER — Telehealth: Payer: Self-pay | Admitting: Sports Medicine

## 2017-11-24 NOTE — Telephone Encounter (Signed)
  I spoke with David Cox yesterday on the phone after reviewing the x-ray of his right knee. He has chronic changes in the patellar tendon consistent with his previous patellar tendon rupture and repair. Minimal degenerative changes are seen. His knee pain continues to improve. He is going to be leaving soon [place] a trip out [place] which will require a lot of walking. If his symptoms continue to improve to the point of resolution then we may cancel his MRI. However, if symptoms persist or begin to worsen then we will need to get the MRI to rule out a significant medial meniscal tear. In the meantime, he will continue with treatment as outlined in his office note from earlier this week.

## 2017-12-04 ENCOUNTER — Ambulatory Visit (INDEPENDENT_AMBULATORY_CARE_PROVIDER_SITE_OTHER): Payer: Self-pay | Admitting: Emergency Medicine

## 2017-12-04 VITALS — BP 110/78 | HR 92 | Temp 98.4°F | Resp 20 | Wt 221.6 lb

## 2017-12-04 DIAGNOSIS — J069 Acute upper respiratory infection, unspecified: Secondary | ICD-10-CM

## 2017-12-04 MED ORDER — PSEUDOEPHEDRINE HCL ER 120 MG PO TB12: 120.0000 mg | ORAL_TABLET | Freq: Two times a day (BID) | ORAL | 0 refills | Status: AC

## 2017-12-04 MED ORDER — IPRATROPIUM BROMIDE 0.06 % NA SOLN: 2.0000 | Freq: Four times a day (QID) | NASAL | 0 refills | Status: AC

## 2017-12-04 MED ORDER — AZITHROMYCIN 250 MG PO TABS: ORAL_TABLET | ORAL | 0 refills | Status: AC

## 2017-12-04 MED FILL — IPRATROPIUM 0.06% SPRAY: 0.06 | 30 days supply | Qty: 15 | Fill #0

## 2017-12-04 MED FILL — SM NASAL DECONGEST ER 120 M: 120 | 15 days supply | Qty: 30 | Fill #0

## 2017-12-04 NOTE — Progress Notes (Signed)
Subjective:     David Cox is a 47 y.o. male who presents for evaluation of symptoms of a URI. Symptoms include congestion and nasal congestion along with muscle aches. Deneis N/V/D, ear pain or pressure, or other symptoms. Onset of symptoms was 3 days ago, and has been stable since that time. Treatment to date: none.  The following portions of the patient's history were reviewed and updated as appropriate: allergies and current medications.  Review of Systems Pertinent items are noted in HPI.   Objective:      Vitals:   12/04/17 1108  BP: 110/78  Pulse: 92  Resp: 20  Temp: 98.4 F (36.9 C)  SpO2: 93%   Physical Exam  Constitutional: He appears well-developed and well-nourished. No distress.  HENT:  Head: Normocephalic.  Right Ear: Tympanic membrane and external ear normal.  Left Ear: Tympanic membrane and external ear normal.  Nose: Rhinorrhea present. Right sinus exhibits no maxillary sinus tenderness and no frontal sinus tenderness. Left sinus exhibits no maxillary sinus tenderness and no frontal sinus tenderness.  Eyes: Conjunctivae are normal.  Neck: Neck supple.  Cardiovascular: Normal rate and regular rhythm.  Pulmonary/Chest: Effort normal.  Rhonchi right lower base, cleared with cough  Lymphadenopathy:       Head (right side): Submandibular adenopathy present.       Head (left side): Submandibular adenopathy present.    He has no cervical adenopathy.  Neurological: He is alert.  Skin: Skin is warm and dry. He is not diaphoretic.  Nursing note and vitals reviewed.   Assessment:    viral upper respiratory illness   Plan:   Rhonchi right lower base, cleared with cough, clinical picture not consistent with CAP. Most likely Dx viral URI. Will provide delay fill antibiotic should symptoms persist or worsen.   Discussed diagnosis and treatment of URI. Discussed the importance of avoiding unnecessary antibiotic therapy. Suggested symptomatic OTC  remedies. Nasal saline spray for congestion. Follow up as needed. Provided delay fill Rx for Azithromycin, recommended not filling for 3-4 days or sooner if symptoms worsen

## 2017-12-04 NOTE — Patient Instructions (Signed)

## 2017-12-05 MED FILL — AZITHROMYCIN 250 MG TABLET: 250 | 5 days supply | Qty: 6 | Fill #0

## 2017-12-27 MED FILL — ROSUVASTATIN CALCIUM 10 MG: 10 | 90 days supply | Qty: 90 | Fill #0 | Status: TO

## 2017-12-27 MED FILL — EDARBYCLOR 40-12.5 MG TAB: 40-12.5 | 30 days supply | Qty: 30 | Fill #2

## 2018-01-12 IMAGING — DX DG TOE GREAT 2+V*R*
3 series · 3 of 3 positions shown · non-contrast
Comparison: None.

CLINICAL DATA: Fractured great toe, right, with routine healing,
subsequent encounter.

EXAM:
RIGHT GREAT TOE

[toe ap]
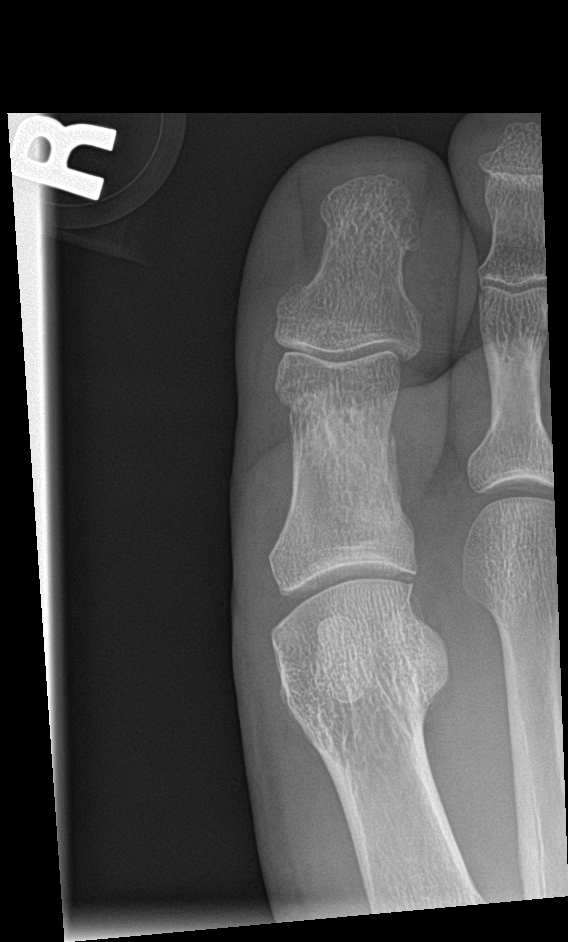

[toe obl]
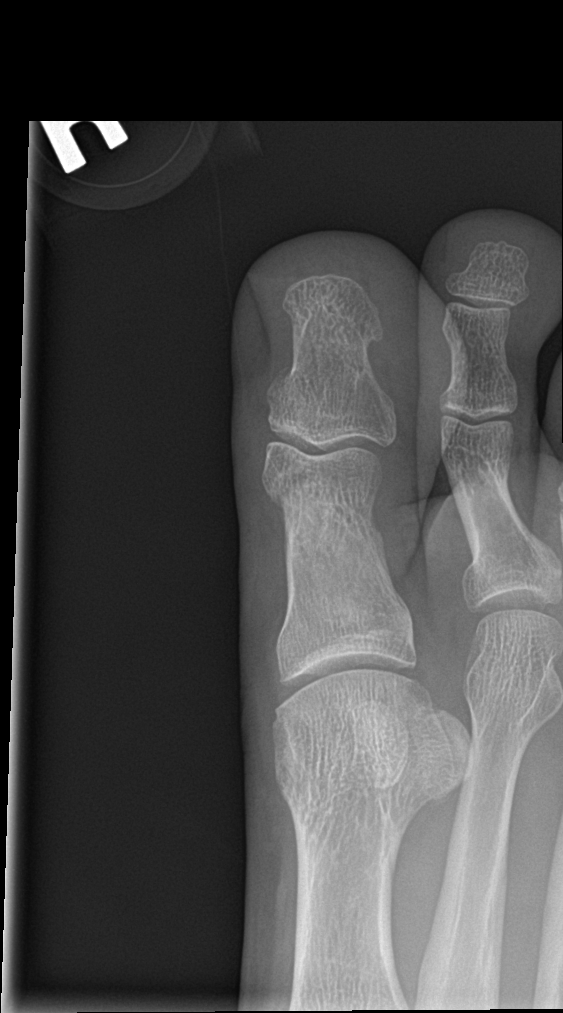

[toe lat]
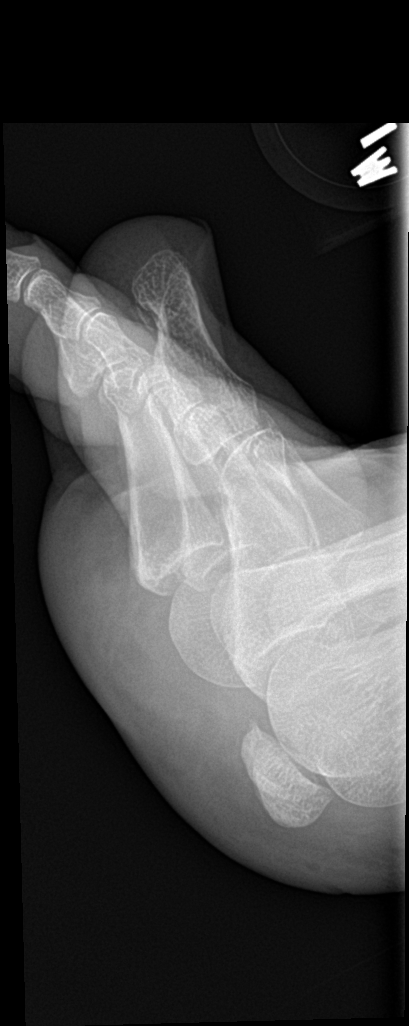

[3 of 3 positions shown; findings below may reference images not displayed]

FINDINGS: Continued presence of minimally displaced fracture involving the
distal portion of the first proximal phalanx. Fracture line is no
longer present with sclerosis present suggesting healing.
IMPRESSION: Healing first proximal phalangeal fracture.

## 2018-02-13 MED FILL — EDARBYCLOR 40-12.5 MG TAB: 40-12.5 | 30 days supply | Qty: 30 | Fill #3

## 2018-03-05 DIAGNOSIS — B360 Pityriasis versicolor: Secondary | ICD-10-CM | POA: Diagnosis not present

## 2018-03-06 MED FILL — KETOCONAZOLE 2% SHAMPOO: 2 | 20 days supply | Qty: 120 | Fill #0

## 2018-03-06 MED FILL — KETOCONAZOLE 200 MG TABLET: 200 | 21 days supply | Qty: 6 | Fill #0

## 2018-03-21 MED FILL — ROSUVASTATIN CALCIUM 10 MG: 10 | 90 days supply | Qty: 90 | Fill #1 | Status: TO

## 2018-03-21 MED FILL — EDARBYCLOR 40-12.5 MG TAB: 40-12.5 | 90 days supply | Qty: 90 | Fill #4 | Status: TO

## 2018-03-22 MED FILL — KETOCONAZOLE 2% SHAMPOO: 2 | 20 days supply | Qty: 120 | Fill #1

## 2018-03-22 MED FILL — KETOCONAZOLE 200 MG TABLET: 200 | 21 days supply | Qty: 6 | Fill #1
